# Patient Record
Sex: Male | Born: 1985 | Race: White | Hispanic: No | Marital: Single | Smoking: Never smoker
Health system: Southern US, Community
[De-identification: ages and names within clinical notes are randomized; demographics above are authoritative.]

## PROBLEM LIST (undated history)

## (undated) DIAGNOSIS — Z789 Other specified health status: Secondary | ICD-10-CM

---
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
## 2007-06-02 ENCOUNTER — Encounter: Admission: RE | Admit: 2007-06-02 | Discharge: 2007-06-02 | Payer: Self-pay | Admitting: Orthopedic Surgery

## 2010-05-31 NOTE — Discharge Summary (Signed)
[place]  Patient:    Randall Fischer, Randall Fischer                      MRN: [id number] Adm. Date:  [id number] Disc. Date: [id number] Attending:  Carlena Sax CC:         Velvet Bathe, M.D.   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  PROCEDURE:  Extensive excision of right-sided cervical plunging ranula and removal of portion of right sublingual gland on July 02, 2000.  BRIEF HISTORY:  Please note full dictated history and physical examination in chart.  The patient is a 25 year old white male who has a long history of fluctuating right-sided neck mass which on physical examination MRI of the neck does reveal a cystic mass involving the right sublingual gland which is pretty much consistent with a plunging right sublingual ranula.  We are going to proceed with surgical excision.  I have discussed this with the family and the patient including risks and benefits.  HOSPITAL COURSE:  The patient underwent excision of right plunging ranula and portion of right sublingual gland via a cervical approach on July 02, 2000. He tolerated this well without difficulty.  One closed Jackson-Pratt drain was placed in the wound.  The patient was then transferred to the recovery room and then to the pediatric nursing floor in stable condition.  He was kept on IV Ancef and Vioxx for pain in addition to other supportive medications and local wound care and clean bacitracin ointment.  Drain output was monitored for postoperative day #1 and postoperative day #2.  By postoperative day #2, the drain had substantially decreased in its amount and therefore the drain was removed at the bedside without difficulty.  A pressure bandage was placed over the wound and around the patients neck without difficulty.  At this point the patient is noted to be improved enough to be discharged to home.  DISCHARGE  MEDICATIONS: 1. Keflex 500 mg p.o. t.i.d. for 10 days. 2. Vioxx 50 mg tablets #10 with no refills one tablet p.o. q.d. for 10 days. 3. Vicodin #30 with three refills one to two tablets p.o. q.4-6h. p.r.n. pain.  He is to have regular diet and activity.  Family is to remove the dressing on postoperative day #3 and apply bacitracin ointment to the wound three times daily and may shower and get the wound wet without difficulty.  He will follow up in the office for postoperative check on July 07, 2000.  CONDITION ON DISCHARGE:  Stable. DD:  07/04/00 TD:  07/06/00 Job: [id number]

## 2010-05-31 NOTE — H&P (Signed)
[place]  Patient:    Randall Fischer, Randall Fischer                      MRN: [id number] Adm. Date:  [id number] Attending:  Carlena Sax CC:         Velvet Bathe, M.D.   History and Physical  CHIEF COMPLAINT:  Right neck mass.  HISTORY OF PRESENT ILLNESS:  The patient is an otherwise healthy 25 year old white male who gives a many year history of a fluctuating right neck mass which is soft in nature.  He has been seen by pediatricians and recently by Dr. Velvet Bathe who noticed a large, soft right neck mass just below the angle of the mandible.  He denies any pain or tenderness in this area.  Denies any infection or significant acute swelling of the area and has had an extensive work up by myself with MRI of the neck.  This did show a large cystic mass pretty much consistent with a plunging ranula.  Based on this history and physical examination I have recommended a cervical excision of this ranula including the right sublingual gland for which he is presenting now.  CURRENT MEDICATIONS:  None.  ALLERGIES:  No known drug allergies.  PAST MEDICAL HISTORY AND SURGICAL HISTORY:  Besides above is otherwise, unremarkable.  PHYSICAL EXAMINATION:  GENERAL:  The patient is a well-developed, well-nourished 25 year old white male in no acute distress.  HEENT:  Head is normocephalic, atraumatic.  Eyes are PERRL, extraocular movements are intact.  Facial nerve is intact bilaterally.  Ears-both tympanic membranes are intact without fluid.  External canals appear normal.  Oral cavity and oral mucosa teeth and gums without lesions.  NECK:  The neck does show about a 3 x 3 cm soft mass involving the right submandibular space.  It is mobile and tender.  The rest of the neck examination is otherwise, unremarkable.  CHEST:  Clear to percussion and auscultation.  Cardiac is regular rate and rhythm with normal S1 and S2 without S3, S4 or murmurs.  ABDOMEN:   Positive bowel sounds, soft without masses, distention or tenderness or organomegaly.  EXTREMITIES:  Full range of motion, warm without cyanosis, clubbing or edema.  NEUROLOGIC:  Examination is awake, alert and oriented x 3.  Cranial nerves 2-12 are grossly intact, nonfocal.  ASSESSMENT:  This is a 25 year old white male with a history of a plunging ranula involving the right neck and sublingual gland based on physical examination and MRI of the neck performed with and without gadolinium.  PLAN:  The patient will undergo excision of this plunging ranula including not only the cyst but the portion of the sublingual gland from which it has originated for under general anesthesia in the GOR.  We have discussed extensively with the family the risks and benefits of surgery including the risks of general anesthesia, infection, bleeding, injury to the great vessels and cranial nerves and normal recovery period to expect after this type of surgery.  We will go ahead and proceed as noted and he will be monitored postoperatively for drain output. DD:  07/03/00 TD:  07/03/00 Job: [id number]

## 2010-05-31 NOTE — Op Note (Signed)
[place]  Patient:    Randall Fischer, Randall Fischer                      MRN: [id number] Proc. Date: 07/02/00 Adm. Date:  [id number] Attending:  Carlena Sax                           Operative Report  PREOPERATIVE DIAGNOSIS:  Plunging ranula, right neck and right sublingual gland.  POSTOPERATIVE DIAGNOSIS:  Plunging ranula, right neck and right sublingual gland.  OPERATION PERFORMED:  Extensive removal of right plunging ranula via cervical approach and removal of sublingual gland.  SURGEON:  Veverly Fells. Arletha Grippe, M.D.  ASSISTANT:  Kinnie Scales. Annalee Genta, M.D.  ANESTHESIA:  General endotracheal.  INDICATIONS FOR PROCEDURE:  The patient is a 25 year old white male who has a long history of swelling, soft in nature, involving the right submandibular area.  This has been fluctuant in nature.  He denies any significant pain or infection of this area.  Physical examination did show a soft compressible mass involving the right submandibular triangle.  Floor of mouth appeared normal.  MRI of the neck was obtained which did show a fluid filled cyst consistent with a ranula arising from the sublingual gland.  Based on his history and physical examination, we have recommended proceeding with the above noted surgical procedure.  I have discussed extensively with him and his family the risks and benefits of surgery including the risk of general anesthesia, infection, bleeding, injury to the great vessels, injury to marginal mandibular nerve, lingual nerve and hypoglossal nerve with him.  I have entertained any questions, answered them appropriately.  Informed consent has been obtained the patient presents for the above noted procedure.  OPERATIVE FINDINGS:  Extensive fluid-filled ranula involving the right submandibular triangle and sublingual gland.  DESCRIPTION OF PROCEDURE:  The patient was brought to the operating room and placed in supine position.  General  endotracheal anesthesia was administered via the anesthesiologist without complications. The patient was administered 1 gm of Ancef IV x 1 and 10 mg of Decadron IV x 1.  The head of the table was turned 90 degrees and the shoulder was placed shoulder roll.  His head was placed on a donut.  The right side of the patients neck was sterilely prepped and draped in standard fashion.  A horizontal submandibular incision approximately two fingerbreadths from the inferior aspect of the mandible was marked out approximately 4 cm in length. This was injected with a total of 7 cc of a 1% lidocaine solution with 1:100,000 epinephrine.  The skin incision was made after waiting approximately 10 minutes for the local to work with a scalpel blade and dissection was carried down sharply through subcutaneous tissues and through platysma.  Subplatysmal planes were elevated superiorly and inferiorly to preserve the marginal mandibular nerve.  A large fluid-filled cyst was identified and was identified and was both bluntly and sharply dissected from the floor of mouth musculature and from the submandibular gland.  Bleeding was controlled with 3-0 silk ties and bipolar cautery without difficulty.  This cyst was then tracked up below the mandible up to the floor of mouth where it was noted to be arising from the sublingual gland on the right side.  The major portion of the sublingual gland was excised using the bipolar and Metzenbaum scissors without difficulty.  This mass was then excised which included a portion of the sublingual  gland.  It was removed and sent to surgical pathology for permanent section analysis. The area of resection was irrigated with copious amounts of irrigation fluid and bleeding was controlled with bipolar cautery.  A 10 mm full fluted flat Blake drain was inserted through a stab incision through the inferior skin flap into the wound and into the submandibular space.  It was cut  appropriately.  It was sutured to the skin with a 2-0 nylon suture.  Subcutaneous tissues and platysma were reapproximated with multiple interrupted 4-0 Vicryl suture and a final skin closure was achieved with a running 5-0 nylon stitch.  Fluids given during the procedure were approximately 500 cc crystalloid.  Estimated blood loss for this procedure was less than 50 cc.  Urine output was not measured. The above noted closed suction drain was placed.  SPECIMENS:  Ranula and portions of the sublingual gland.  The patient tolerated the procedure well without complications, was extubated in the operating room and transferred to recovery in stable condition. Sponge, needle and instrument counts were correct at end of the case.  Total duration of the procedure was approximately two hours.  The patient will be admitted for overnight recovery.  Once he has recovery.  Once he has recovered well, he will probably sent home on July 03, 2000 on Keflex 500 mg p.o. t.i.d. for 10 days and Vicodin #30 with three refills 1 to 2 tabs p.o. q.4h. p.r.n. pain.  He is to have light activity and regular diet to continue with local wound care including bacitracin ointment to the wound three times daily.  We will see him in the office in approximately seven to 10 days after discharge for suture removal. DD:  07/02/00 TD:  07/02/00 Job: [id number]

## 2012-11-10 ENCOUNTER — Emergency Department: Payer: Self-pay | Admitting: Emergency Medicine

## 2014-03-29 ENCOUNTER — Emergency Department: Payer: Self-pay | Admitting: Internal Medicine

## 2014-05-14 NOTE — Op Note (Signed)
PATIENT NAME:  Randall Fischer, [name] MR#:  [id number] DATE OF BIRTH:  May 22, 1985  DATE OF PROCEDURE:  03/29/2014  PREPROCEDURE DIAGNOSIS: Left fingertip open amputation.   POSTOPERATIVE DIAGNOSIS:  Left fingertip open amputation.  PROCEDURE: Revision amputation left little finger.   ANESTHESIA: Digital block.   SURGEON: Leitha SchullerMichael J Moorea Boissonneault, MD   SURGERY PERFORMED: In the ER.   DESCRIPTION OF PROCEDURE: Appropriate patient identification and timeout procedure were completed; the skin was prepped with Betadine. There was bone protruding from the end of the stump. This was debrided with use of a bone rongeur just to get to a smooth distal phalanx that was proximal to the cut flaps from the amputation. There was some loose skin also excised with a scalpel by sharp dissection.   Following this, the wound was dressed with Xeroform over the end of the finger, and 2 x 2's, and a 2 inch Kling. The patient was subsequently sent home in stable condition with instructions to follow up in 3 to 4 days.    ____________________________ Leitha SchullerMichael J. Jaceyon Strole, MD mjm:nt D: 04/24/2014 12:21:52 ET T: 04/24/2014 16:37:16 ET JOB#: [id number]  cc: Leitha SchullerMichael J. Sorin Frimpong, MD, <Dictator> Leitha SchullerMICHAEL J Siler Mavis MD ELECTRONICALLY SIGNED 04/25/2014 9:59

## 2017-09-12 ENCOUNTER — Other Ambulatory Visit: Payer: Self-pay

## 2017-09-12 ENCOUNTER — Observation Stay
Admission: EM | Admit: 2017-09-12 | Discharge: 2017-09-13 | Disposition: A | Discharge status: Home / Self Care | Payer: BLUE CROSS/BLUE SHIELD | Attending: Orthopedic Surgery | Admitting: Orthopedic Surgery

## 2017-09-12 ENCOUNTER — Encounter: Payer: Self-pay | Admitting: Emergency Medicine

## 2017-09-12 ENCOUNTER — Emergency Department: Payer: BLUE CROSS/BLUE SHIELD

## 2017-09-12 DIAGNOSIS — M79659 Pain in unspecified thigh: Secondary | ICD-10-CM | POA: Diagnosis present

## 2017-09-12 DIAGNOSIS — S92421A Displaced fracture of distal phalanx of right great toe, initial encounter for closed fracture: Principal | ICD-10-CM | POA: Insufficient documentation

## 2017-09-12 DIAGNOSIS — Z79899 Other long term (current) drug therapy: Secondary | ICD-10-CM | POA: Diagnosis not present

## 2017-09-12 DIAGNOSIS — E876 Hypokalemia: Secondary | ICD-10-CM | POA: Insufficient documentation

## 2017-09-12 DIAGNOSIS — R2689 Other abnormalities of gait and mobility: Secondary | ICD-10-CM | POA: Insufficient documentation

## 2017-09-12 DIAGNOSIS — M79651 Pain in right thigh: Secondary | ICD-10-CM

## 2017-09-12 DIAGNOSIS — Y929 Unspecified place or not applicable: Secondary | ICD-10-CM | POA: Diagnosis not present

## 2017-09-12 DIAGNOSIS — S7011XA Contusion of right thigh, initial encounter: Secondary | ICD-10-CM | POA: Insufficient documentation

## 2017-09-12 HISTORY — DX: Other specified health status: Z78.9

## 2017-09-12 LAB — CBC WITH DIFFERENTIAL/PLATELET
Basophils Absolute: 0 10*3/uL (ref 0–0.1)
Basophils Relative: 0 %
Eosinophils Absolute: 0.3 10*3/uL (ref 0–0.7)
Eosinophils Relative: 3 %
HCT: 44.6 % (ref 40.0–52.0)
Hemoglobin: 15.7 g/dL (ref 13.0–18.0)
Lymphocytes Relative: 17 %
Lymphs Abs: 2.2 10*3/uL (ref 1.0–3.6)
MCH: 32.7 pg (ref 26.0–34.0)
MCHC: 35.2 g/dL (ref 32.0–36.0)
MCV: 93.1 fL (ref 80.0–100.0)
Monocytes Absolute: 0.7 10*3/uL (ref 0.2–1.0)
Monocytes Relative: 6 %
Neutro Abs: 9.7 10*3/uL — ABNORMAL HIGH (ref 1.4–6.5)
Neutrophils Relative %: 74 %
Platelets: 264 10*3/uL (ref 150–440)
RBC: 4.79 MIL/uL (ref 4.40–5.90)
RDW: 13.2 % (ref 11.5–14.5)
WBC: 12.9 10*3/uL — ABNORMAL HIGH (ref 3.8–10.6)

## 2017-09-12 LAB — BASIC METABOLIC PANEL
Anion gap: 11 (ref 5–15)
BUN: 14 mg/dL (ref 6–20)
CHLORIDE: 104 mmol/L (ref 98–111)
CO2: 22 mmol/L (ref 22–32)
CREATININE: 1.04 mg/dL (ref 0.61–1.24)
Calcium: 9.5 mg/dL (ref 8.9–10.3)
GFR calc Af Amer: >60 mL/min (ref >60)
GFR calc non Af Amer: >60 mL/min (ref >60)
GLUCOSE: 141 mg/dL — AB (ref 70–99)
Potassium: 2.8 mmol/L — ABNORMAL LOW (ref 3.5–5.1)
Sodium: 137 mmol/L (ref 135–145)

## 2017-09-12 LAB — CK: Total CK: 433 U/L — ABNORMAL HIGH (ref 49–397)

## 2017-09-12 MED ORDER — ONDANSETRON HCL 4 MG/2ML IJ SOLN
4.0000 mg | Freq: Once | INTRAMUSCULAR | Status: AC
  Administered 2017-09-12: 4 mg via INTRAVENOUS
  Filled 2017-09-12: qty 2

## 2017-09-12 MED ORDER — SODIUM CHLORIDE 0.9 % IV BOLUS
1000.0000 mL | Freq: Once | INTRAVENOUS | Status: AC
  Administered 2017-09-12: 1000 mL via INTRAVENOUS

## 2017-09-12 MED ORDER — MORPHINE SULFATE (PF) 4 MG/ML IV SOLN
4.0000 mg | Freq: Once | INTRAVENOUS | Status: AC
  Administered 2017-09-12: 4 mg via INTRAVENOUS
  Filled 2017-09-12: qty 1
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
## 2017-09-13 ENCOUNTER — Other Ambulatory Visit: Payer: Self-pay

## 2017-09-13 LAB — CK: CK TOTAL: 1040 U/L — AB (ref 49–397)

## 2017-09-13 MED ORDER — OXYCODONE HCL 5 MG PO TABS
5.0000 mg | ORAL_TABLET | ORAL | Status: DC | PRN
  Administered 2017-09-13 (×2): 5 mg via ORAL
  Filled 2017-09-13 (×2): qty 1

## 2017-09-13 MED ORDER — HYDROCODONE-ACETAMINOPHEN 5-325 MG PO TABS: 1.0000 | ORAL_TABLET | ORAL | 0 refills | Status: AC | PRN

## 2017-09-13 MED ORDER — KETOROLAC TROMETHAMINE 15 MG/ML IJ SOLN
INTRAMUSCULAR | Status: AC
  Administered 2017-09-13: 15 mg via INTRAVENOUS
  Filled 2017-09-13: qty 1

## 2017-09-13 MED ORDER — MORPHINE SULFATE (PF) 2 MG/ML IV SOLN: 2.0000 mg | INTRAVENOUS | Status: DC | PRN

## 2017-09-13 MED ORDER — ACETAMINOPHEN 500 MG PO TABS
1000.0000 mg | ORAL_TABLET | Freq: Four times a day (QID) | ORAL | Status: DC
  Administered 2017-09-13 (×3): 1000 mg via ORAL
  Filled 2017-09-13 (×2): qty 2

## 2017-09-13 MED ORDER — ACETAMINOPHEN 650 MG RE SUPP: 650.0000 mg | Freq: Four times a day (QID) | RECTAL | Status: DC | PRN

## 2017-09-13 MED ORDER — KETOROLAC TROMETHAMINE 15 MG/ML IJ SOLN
15.0000 mg | Freq: Four times a day (QID) | INTRAMUSCULAR | Status: DC
  Administered 2017-09-13 (×3): 15 mg via INTRAVENOUS
  Filled 2017-09-13 (×2): qty 1

## 2017-09-13 MED ORDER — MAGNESIUM HYDROXIDE 400 MG/5ML PO SUSP: 30.0000 mL | Freq: Every day | ORAL | Status: DC | PRN

## 2017-09-13 MED ORDER — BISACODYL 10 MG RE SUPP: 10.0000 mg | Freq: Every day | RECTAL | Status: DC | PRN

## 2017-09-13 MED ORDER — ACETAMINOPHEN 500 MG PO TABS
ORAL_TABLET | ORAL | Status: AC
  Administered 2017-09-13: 1000 mg via ORAL
  Filled 2017-09-13: qty 2

## 2017-09-13 MED ORDER — MAGNESIUM CITRATE PO SOLN
1.0000 | Freq: Once | ORAL | Status: DC | PRN
  Filled 2017-09-13: qty 296

## 2017-09-13 MED ORDER — POTASSIUM CHLORIDE CRYS ER 20 MEQ PO TBCR
40.0000 meq | EXTENDED_RELEASE_TABLET | Freq: Once | ORAL | Status: AC
  Administered 2017-09-13: 40 meq via ORAL
  Filled 2017-09-13: qty 2

## 2017-09-13 MED ORDER — ACETAMINOPHEN 325 MG PO TABS: 650.0000 mg | ORAL_TABLET | Freq: Four times a day (QID) | ORAL | Status: DC | PRN

## 2017-09-13 MED ORDER — DOCUSATE SODIUM 100 MG PO CAPS: 100.0000 mg | ORAL_CAPSULE | Freq: Two times a day (BID) | ORAL | 0 refills | Status: AC

## 2017-09-13 MED ORDER — SENNA 8.6 MG PO TABS
1.0000 | ORAL_TABLET | Freq: Two times a day (BID) | ORAL | Status: DC
  Filled 2017-09-13: qty 1

## 2017-09-13 MED ORDER — DOCUSATE SODIUM 100 MG PO CAPS
100.0000 mg | ORAL_CAPSULE | Freq: Two times a day (BID) | ORAL | Status: DC
  Filled 2017-09-13: qty 1

## 2017-09-13 MED ORDER — LACTATED RINGERS IV SOLN
INTRAVENOUS | Status: DC
  Administered 2017-09-13: 01:00:00 via INTRAVENOUS
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
## 2017-09-13 NOTE — Progress Notes (Signed)
Patient is being discharged home, discharge instruction provided, all appointment reviewed with patient at this time , iv removed, patient awaiting ffor his ride at this time.

## 2017-09-14 DIAGNOSIS — S92421A Displaced fracture of distal phalanx of right great toe, initial encounter for closed fracture: Secondary | ICD-10-CM
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
## 2019-01-21 ENCOUNTER — Ambulatory Visit: Payer: BC Managed Care – PPO | Attending: Internal Medicine

## 2019-01-21 DIAGNOSIS — Z20822 Contact with and (suspected) exposure to covid-19: Secondary | ICD-10-CM

## 2019-01-22 LAB — NOVEL CORONAVIRUS, NAA: SARS-CoV-2, NAA: NOT DETECTED

## 2019-08-31 ENCOUNTER — Other Ambulatory Visit: Payer: Self-pay

## 2019-08-31 ENCOUNTER — Other Ambulatory Visit: Payer: BC Managed Care – PPO

## 2019-08-31 DIAGNOSIS — Z20822 Contact with and (suspected) exposure to covid-19: Secondary | ICD-10-CM

## 2019-09-01 LAB — SARS-COV-2, NAA 2 DAY TAT

## 2019-09-01 LAB — NOVEL CORONAVIRUS, NAA: SARS-CoV-2, NAA: NOT DETECTED
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
## 2021-12-24 ENCOUNTER — Encounter (HOSPITAL_BASED_OUTPATIENT_CLINIC_OR_DEPARTMENT_OTHER): Payer: Self-pay

## 2021-12-24 DIAGNOSIS — G4710 Hypersomnia, unspecified: Secondary | ICD-10-CM

## 2021-12-24 DIAGNOSIS — R5383 Other fatigue: Secondary | ICD-10-CM

## 2021-12-24 DIAGNOSIS — R454 Irritability and anger: Secondary | ICD-10-CM
